# Patient Record
Sex: Female | Born: 1965 | Race: Black or African American | Hispanic: No | Smoking: Never smoker
Health system: Southern US, Community
[De-identification: ages and names within clinical notes are randomized; demographics above are authoritative.]

## PROBLEM LIST (undated history)

## (undated) DIAGNOSIS — I10 Essential (primary) hypertension: Secondary | ICD-10-CM

## (undated) DIAGNOSIS — I639 Cerebral infarction, unspecified: Secondary | ICD-10-CM

## (undated) DIAGNOSIS — N289 Disorder of kidney and ureter, unspecified: Secondary | ICD-10-CM

## (undated) DIAGNOSIS — D649 Anemia, unspecified: Secondary | ICD-10-CM

## (undated) DIAGNOSIS — E119 Type 2 diabetes mellitus without complications: Secondary | ICD-10-CM

## (undated) DIAGNOSIS — F329 Major depressive disorder, single episode, unspecified: Secondary | ICD-10-CM

## (undated) DIAGNOSIS — F32A Depression, unspecified: Secondary | ICD-10-CM

## (undated) DIAGNOSIS — I671 Cerebral aneurysm, nonruptured: Secondary | ICD-10-CM

## (undated) DIAGNOSIS — E079 Disorder of thyroid, unspecified: Secondary | ICD-10-CM

## (undated) HISTORY — PX: BRAIN SURGERY: SHX531

## (undated) HISTORY — DX: Major depressive disorder, single episode, unspecified: F32.9

## (undated) HISTORY — DX: Type 2 diabetes mellitus without complications: E11.9

## (undated) HISTORY — PX: THYROIDECTOMY: SHX17

## (undated) HISTORY — DX: Depression, unspecified: F32.A

## (undated) HISTORY — DX: Anemia, unspecified: D64.9

---

## 1999-10-09 ENCOUNTER — Other Ambulatory Visit: Admission: RE | Admit: 1999-10-09 | Discharge: 1999-10-09 | Payer: Self-pay | Admitting: Obstetrics and Gynecology

## 2001-01-05 ENCOUNTER — Other Ambulatory Visit: Admission: RE | Admit: 2001-01-05 | Discharge: 2001-01-05 | Payer: Self-pay | Admitting: Obstetrics and Gynecology

## 2001-11-30 ENCOUNTER — Inpatient Hospital Stay (HOSPITAL_COMMUNITY): Admission: AD | Admit: 2001-11-30 | Discharge: 2001-11-30 | Payer: Self-pay | Admitting: Obstetrics

## 2001-12-04 ENCOUNTER — Inpatient Hospital Stay (HOSPITAL_COMMUNITY): Admission: AD | Admit: 2001-12-04 | Discharge: 2001-12-04 | Payer: Self-pay | Admitting: Obstetrics & Gynecology

## 2001-12-04 ENCOUNTER — Encounter: Payer: Self-pay | Admitting: Obstetrics & Gynecology

## 2001-12-07 ENCOUNTER — Inpatient Hospital Stay (HOSPITAL_COMMUNITY): Admission: AD | Admit: 2001-12-07 | Discharge: 2001-12-07 | Payer: Self-pay | Admitting: Obstetrics

## 2001-12-07 ENCOUNTER — Encounter: Admission: RE | Admit: 2001-12-07 | Discharge: 2001-12-07 | Payer: Self-pay | Admitting: Obstetrics & Gynecology

## 2001-12-17 ENCOUNTER — Inpatient Hospital Stay (HOSPITAL_COMMUNITY): Admission: AD | Admit: 2001-12-17 | Discharge: 2001-12-17 | Payer: Self-pay | Admitting: Obstetrics

## 2001-12-24 ENCOUNTER — Inpatient Hospital Stay (HOSPITAL_COMMUNITY): Admission: AD | Admit: 2001-12-24 | Discharge: 2001-12-24 | Payer: Self-pay | Admitting: *Deleted

## 2002-10-16 ENCOUNTER — Other Ambulatory Visit: Admission: RE | Admit: 2002-10-16 | Discharge: 2002-10-16 | Payer: Self-pay | Admitting: Obstetrics and Gynecology

## 2003-10-29 ENCOUNTER — Other Ambulatory Visit: Admission: RE | Admit: 2003-10-29 | Discharge: 2003-10-29 | Payer: Self-pay | Admitting: Obstetrics and Gynecology

## 2003-12-13 ENCOUNTER — Emergency Department (HOSPITAL_COMMUNITY): Admission: AD | Admit: 2003-12-13 | Discharge: 2003-12-13 | Payer: Self-pay | Admitting: Family Medicine

## 2004-01-07 ENCOUNTER — Emergency Department (HOSPITAL_COMMUNITY): Admission: EM | Admit: 2004-01-07 | Discharge: 2004-01-07 | Payer: Self-pay | Admitting: Family Medicine

## 2004-09-28 ENCOUNTER — Emergency Department (HOSPITAL_COMMUNITY): Admission: EM | Admit: 2004-09-28 | Discharge: 2004-09-28 | Payer: Self-pay | Admitting: Family Medicine

## 2008-03-19 ENCOUNTER — Other Ambulatory Visit: Admission: RE | Admit: 2008-03-19 | Discharge: 2008-03-19 | Payer: Self-pay | Admitting: Gynecology

## 2009-04-29 ENCOUNTER — Emergency Department (HOSPITAL_COMMUNITY): Admission: EM | Admit: 2009-04-29 | Discharge: 2009-04-29 | Payer: Self-pay | Admitting: Emergency Medicine

## 2010-03-06 ENCOUNTER — Emergency Department (HOSPITAL_COMMUNITY): Admission: EM | Admit: 2010-03-06 | Discharge: 2010-03-06 | Payer: Self-pay | Admitting: Emergency Medicine

## 2010-10-10 ENCOUNTER — Ambulatory Visit (HOSPITAL_COMMUNITY): Payer: Self-pay | Admitting: Psychiatry

## 2010-11-04 ENCOUNTER — Ambulatory Visit (HOSPITAL_COMMUNITY): Payer: Self-pay | Admitting: Psychology

## 2010-11-18 ENCOUNTER — Ambulatory Visit (HOSPITAL_COMMUNITY): Payer: Self-pay | Admitting: Psychiatry

## 2011-02-11 LAB — PROTIME-INR: Prothrombin Time: 12.5 seconds (ref 11.6–15.2)

## 2011-02-11 LAB — BASIC METABOLIC PANEL
BUN: 14 mg/dL (ref 6–23)
CO2: 24 mEq/L (ref 19–32)
Calcium: 8.6 mg/dL (ref 8.4–10.5)
Chloride: 104 mEq/L (ref 96–112)
Creatinine, Ser: 1.18 mg/dL (ref 0.4–1.2)
GFR calc Af Amer: >60 mL/min (ref >60)
GFR calc non Af Amer: 50 mL/min — ABNORMAL LOW (ref >60)
Glucose, Bld: 168 mg/dL — ABNORMAL HIGH (ref 70–99)
Potassium: 3.6 mEq/L (ref 3.5–5.1)
Sodium: 136 mEq/L (ref 135–145)

## 2011-02-11 LAB — CBC
HCT: 40.5 % (ref 36.0–46.0)
Hemoglobin: 13.4 g/dL (ref 12.0–15.0)
MCHC: 33 g/dL (ref 30.0–36.0)
MCV: 83.9 fL (ref 78.0–100.0)
Platelets: 259 10*3/uL (ref 150–400)
RBC: 4.83 MIL/uL (ref 3.87–5.11)
RDW: 14.7 % (ref 11.5–15.5)
WBC: 6.6 10*3/uL (ref 4.0–10.5)

## 2011-02-11 LAB — DIFFERENTIAL
Basophils Absolute: 0.1 10*3/uL (ref 0.0–0.1)
Eosinophils Absolute: 0.1 10*3/uL (ref 0.0–0.7)
Eosinophils Relative: 2 % (ref 0–5)
Lymphs Abs: 2.2 10*3/uL (ref 0.7–4.0)

## 2011-02-11 LAB — POCT CARDIAC MARKERS

## 2011-04-13 ENCOUNTER — Other Ambulatory Visit (HOSPITAL_COMMUNITY): Payer: Self-pay | Admitting: Family Medicine

## 2011-04-15 ENCOUNTER — Other Ambulatory Visit (HOSPITAL_COMMUNITY): Payer: Self-pay | Admitting: Family Medicine

## 2011-04-15 DIAGNOSIS — Z1231 Encounter for screening mammogram for malignant neoplasm of breast: Secondary | ICD-10-CM

## 2011-04-24 ENCOUNTER — Ambulatory Visit (HOSPITAL_COMMUNITY)
Admission: RE | Admit: 2011-04-24 | Discharge: 2011-04-24 | Disposition: A | Discharge status: Home / Self Care | Payer: PRIVATE HEALTH INSURANCE | Source: Ambulatory Visit | Attending: Family Medicine | Admitting: Family Medicine

## 2011-04-24 DIAGNOSIS — Z1231 Encounter for screening mammogram for malignant neoplasm of breast: Secondary | ICD-10-CM | POA: Insufficient documentation

## 2011-05-19 ENCOUNTER — Other Ambulatory Visit: Payer: Self-pay | Admitting: Family Medicine

## 2011-05-19 DIAGNOSIS — R928 Other abnormal and inconclusive findings on diagnostic imaging of breast: Secondary | ICD-10-CM

## 2011-05-21 ENCOUNTER — Ambulatory Visit
Admission: RE | Admit: 2011-05-21 | Discharge: 2011-05-21 | Disposition: A | Discharge status: Home / Self Care | Payer: PRIVATE HEALTH INSURANCE | Source: Ambulatory Visit | Attending: Family Medicine | Admitting: Family Medicine

## 2011-05-21 DIAGNOSIS — R928 Other abnormal and inconclusive findings on diagnostic imaging of breast: Secondary | ICD-10-CM

## 2011-06-09 ENCOUNTER — Emergency Department (HOSPITAL_COMMUNITY): Payer: PRIVATE HEALTH INSURANCE

## 2011-06-09 ENCOUNTER — Emergency Department (HOSPITAL_COMMUNITY)
Admission: EM | Admit: 2011-06-09 | Discharge: 2011-06-09 | Disposition: A | Discharge status: Home / Self Care | Payer: PRIVATE HEALTH INSURANCE | Attending: Emergency Medicine | Admitting: Emergency Medicine

## 2011-06-09 DIAGNOSIS — R51 Headache: Secondary | ICD-10-CM | POA: Insufficient documentation

## 2011-06-09 DIAGNOSIS — Z9889 Other specified postprocedural states: Secondary | ICD-10-CM | POA: Insufficient documentation

## 2011-06-09 DIAGNOSIS — G9389 Other specified disorders of brain: Secondary | ICD-10-CM | POA: Insufficient documentation

## 2011-06-09 DIAGNOSIS — R42 Dizziness and giddiness: Secondary | ICD-10-CM | POA: Insufficient documentation

## 2011-06-09 DIAGNOSIS — N289 Disorder of kidney and ureter, unspecified: Secondary | ICD-10-CM | POA: Insufficient documentation

## 2011-06-09 DIAGNOSIS — E039 Hypothyroidism, unspecified: Secondary | ICD-10-CM | POA: Insufficient documentation

## 2011-06-09 LAB — POCT I-STAT, CHEM 8
Creatinine, Ser: 1.7 mg/dL — ABNORMAL HIGH (ref 0.50–1.10)
Hemoglobin: 12.9 g/dL (ref 12.0–15.0)
Potassium: 4 mEq/L (ref 3.5–5.1)
Sodium: 139 mEq/L (ref 135–145)

## 2011-06-19 ENCOUNTER — Inpatient Hospital Stay (INDEPENDENT_AMBULATORY_CARE_PROVIDER_SITE_OTHER)
Admission: RE | Admit: 2011-06-19 | Discharge: 2011-06-19 | Disposition: A | Discharge status: Home / Self Care | Payer: PRIVATE HEALTH INSURANCE | Source: Ambulatory Visit | Attending: Family Medicine | Admitting: Family Medicine

## 2011-06-19 DIAGNOSIS — M25469 Effusion, unspecified knee: Secondary | ICD-10-CM

## 2011-12-26 ENCOUNTER — Encounter (HOSPITAL_COMMUNITY): Payer: Self-pay | Admitting: *Deleted

## 2011-12-26 ENCOUNTER — Emergency Department (HOSPITAL_COMMUNITY)
Admission: EM | Admit: 2011-12-26 | Discharge: 2011-12-26 | Disposition: A | Discharge status: Home / Self Care | Payer: Self-pay | Attending: Emergency Medicine | Admitting: Emergency Medicine

## 2011-12-26 DIAGNOSIS — Z79899 Other long term (current) drug therapy: Secondary | ICD-10-CM | POA: Insufficient documentation

## 2011-12-26 DIAGNOSIS — I10 Essential (primary) hypertension: Secondary | ICD-10-CM | POA: Insufficient documentation

## 2011-12-26 HISTORY — DX: Cerebral aneurysm, nonruptured: I67.1

## 2011-12-26 HISTORY — DX: Essential (primary) hypertension: I10

## 2011-12-26 HISTORY — DX: Disorder of kidney and ureter, unspecified: N28.9

## 2011-12-26 NOTE — ED Provider Notes (Signed)
History     CSN: [id number]  Arrival date & time 12/26/11  1208   First MD Initiated Contact with Patient 12/26/11 1242      Chief Complaint  Patient presents with  . Hypertension    (Consider location/radiation/quality/duration/timing/severity/associated sxs/prior treatment) Patient is a 46 y.o. female presenting with hypertension. The history is provided by the patient.  Hypertension This is a recurrent problem. The current episode started today. The problem occurs constantly. The problem has been resolved. Pertinent negatives include no abdominal pain, chest pain, diaphoresis, fatigue, fever, headaches, nausea, neck pain, numbness, rash, vertigo, visual change, vomiting or weakness. The symptoms are aggravated by nothing. She has tried nothing for the symptoms.  Pt saw nephrologist yesterday and was told to check her blood pressure daily. Reports to me she was visiting the nephrologist to discuss labs and her kidney function "is at 45". Check BP today multiple times with a home wrist cuff and had values 160-180 systolic and diasolic values in the 110-120 range. There were no other symptoms associated with the elevated blood pressure readings.  Past Medical History  Diagnosis Date  . Hypertension   . Renal disorder   . Brain aneurysm     Past Surgical History  Procedure Date  . Thyroidectomy     No family history on file.  History  Substance Use Topics  . Smoking status: Never Smoker   . Smokeless tobacco: Not on file  . Alcohol Use: Yes     social     Review of Systems  Constitutional: Negative for fever, diaphoresis and fatigue.  HENT: Negative for nosebleeds, neck pain and neck stiffness.   Eyes: Negative for pain and visual disturbance.  Respiratory: Negative for chest tightness and shortness of breath.   Cardiovascular: Negative for chest pain.  Gastrointestinal: Negative for nausea, vomiting and abdominal pain.  Musculoskeletal: Negative for back pain and gait  problem.  Skin: Negative for rash.  Neurological: Negative for dizziness, vertigo, speech difficulty, weakness, numbness and headaches.  Psychiatric/Behavioral: Negative for confusion.    Allergies  Hydrocodone  Home Medications   Current Outpatient Rx  Name Route Sig Dispense Refill  . LABETALOL HCL 200 MG PO TABS Oral Take 200 mg by mouth 2 (two) times daily.    Marland Kitchen LEVOTHYROXINE SODIUM 175 MCG PO TABS Oral Take 175 mcg by mouth daily.    . MULTI-VITAMIN/MINERALS PO TABS Oral Take 1 tablet by mouth daily.    Marland Kitchen OLMESARTAN MEDOXOMIL-HCTZ 20-12.5 MG PO TABS Oral Take 1 tablet by mouth daily.      BP 121/72  Pulse 66  Temp(Src) 98.8 F (37.1 C) (Oral)  Resp 16  SpO2 98%  Physical Exam  Nursing note and vitals reviewed. Constitutional: She is oriented to person, place, and time. She appears well-developed and well-nourished. No distress.  HENT:  Head: Normocephalic and atraumatic.  Right Ear: External ear normal.  Left Ear: External ear normal.  Mouth/Throat: Oropharynx is clear and moist.  Eyes: Pupils are equal, round, and reactive to light.  Neck: Normal range of motion.  Cardiovascular: Normal rate, regular rhythm, normal heart sounds and intact distal pulses.   Pulmonary/Chest: Effort normal. No respiratory distress.  Abdominal: Soft. She exhibits no distension. There is no tenderness.  Musculoskeletal: She exhibits no edema and no tenderness.  Neurological: She is alert and oriented to person, place, and time. No cranial nerve deficit. She exhibits normal muscle tone. Coordination normal.  Skin: Skin is warm and dry. No rash noted.  Psychiatric: She has a normal mood and affect. Her behavior is normal.    ED Course  Procedures (including critical care time)  Labs Reviewed - No data to display No results found.   1. HTN (hypertension)       MDM  Reported HTN at home. No HTN in ED on 3 separate checks- checked by myself on exam with result of 139/76. Pt in no  distress and with no physical complaints or abnormal PE findings. Reports to me recent lab studies that have been reviewed by nephrologist. Will d/c home.Encouraged continued BP monitoring.        Shaaron Adler, [place] 12/26/11 1319

## 2011-12-26 NOTE — ED Provider Notes (Signed)
Medical screening examination/treatment/procedure(s) were performed by non-physician practitioner and as supervising physician I was immediately available for consultation/collaboration.   Keisha Amer, MD 12/26/11 1604 

## 2011-12-26 NOTE — ED Notes (Signed)
Pt states she was advised by her nephrologist yesterday to take her BP on a daily basis.  Pt took her BP at home today using an automated wrist cuff and her systolic BP was from 160 - 180 over 3 hours.  Pt denies N/V, SOB, chest pain, lethargy and dizziness.

## 2011-12-29 ENCOUNTER — Emergency Department (INDEPENDENT_AMBULATORY_CARE_PROVIDER_SITE_OTHER)
Admission: EM | Admit: 2011-12-29 | Discharge: 2011-12-29 | Disposition: A | Discharge status: Home / Self Care | Payer: Self-pay | Source: Home / Self Care | Attending: Family Medicine | Admitting: Family Medicine

## 2011-12-29 ENCOUNTER — Encounter (HOSPITAL_COMMUNITY): Payer: Self-pay

## 2011-12-29 DIAGNOSIS — J069 Acute upper respiratory infection, unspecified: Secondary | ICD-10-CM

## 2011-12-29 HISTORY — DX: Cerebral infarction, unspecified: I63.9

## 2011-12-29 MED ORDER — IPRATROPIUM BROMIDE 0.06 % NA SOLN: 2.0000 | Freq: Four times a day (QID) | NASAL | Status: DC

## 2011-12-29 NOTE — ED Provider Notes (Signed)
History     CSN: [id number]  Arrival date & time 12/29/11  0803   First MD Initiated Contact with Patient 12/29/11 [phone]      Chief Complaint  Patient presents with  . Sinusitis    (Consider location/radiation/quality/duration/timing/severity/associated sxs/prior treatment) Patient is a 46 y.o. female presenting with sinusitis. The history is provided by the patient.  Sinusitis  This is a new problem. The current episode started more than 2 days ago. The problem has not changed since onset.There has been no fever. The patient is experiencing no pain. Associated symptoms include congestion and sore throat. The treatment provided no relief.    Past Medical History  Diagnosis Date  . Hypertension   . Renal disorder   . Brain aneurysm   . Stroke     Past Surgical History  Procedure Date  . Thyroidectomy   . Brain surgery     No family history on file.  History  Substance Use Topics  . Smoking status: Never Smoker   . Smokeless tobacco: Not on file  . Alcohol Use: Yes     social    OB History    Grav Para Term Preterm Abortions TAB SAB Ect Mult Living                  Review of Systems  Constitutional: Negative.   HENT: Positive for congestion, sore throat, rhinorrhea and postnasal drip. Negative for nosebleeds.   Respiratory: Negative.   Skin: Negative.     Allergies  Hydrocodone  Home Medications   Current Outpatient Rx  Name Route Sig Dispense Refill  . IRON 325 (65 FE) MG PO TABS Oral Take by mouth daily.    . METHYLDOPA 250 MG PO TABS Oral Take 250 mg by mouth 3 (three) times daily.    . IPRATROPIUM BROMIDE 0.06 % NA SOLN Nasal Place 2 sprays into the nose 4 (four) times daily. 15 mL 12  . LABETALOL HCL 200 MG PO TABS Oral Take 200 mg by mouth 2 (two) times daily.    Marland Kitchen LEVOTHYROXINE SODIUM 175 MCG PO TABS Oral Take 175 mcg by mouth daily.    . MULTI-VITAMIN/MINERALS PO TABS Oral Take 1 tablet by mouth daily.    Marland Kitchen OLMESARTAN MEDOXOMIL-HCTZ 20-12.5 MG  PO TABS Oral Take 1 tablet by mouth daily.      BP 144/87  Pulse 89  Temp(Src) 98.5 F (36.9 C) (Oral)  Resp 17  SpO2 95%  LMP 12/27/2011  Physical Exam  Nursing note and vitals reviewed. Constitutional: She is oriented to person, place, and time. She appears well-developed and well-nourished.  HENT:  Head: Normocephalic.  Right Ear: External ear normal.  Left Ear: External ear normal.  Nose: Mucosal edema and rhinorrhea present.  Mouth/Throat: Oropharynx is clear and moist.  Eyes: Pupils are equal, round, and reactive to light.  Neck: Normal range of motion. Neck supple.  Cardiovascular: Normal rate, regular rhythm, normal heart sounds and intact distal pulses.   Pulmonary/Chest: Effort normal and breath sounds normal.  Lymphadenopathy:    She has no cervical adenopathy.  Neurological: She is alert and oriented to person, place, and time.  Skin: Skin is warm and dry.    ED Course  Procedures (including critical care time)  Labs Reviewed - No data to display No results found.   1. URI (upper respiratory infection)       MDM         Barkley Bruns, MD 12/29/11 [phone]

## 2011-12-29 NOTE — ED Notes (Signed)
C/o nasal and sinus congestion, runny nose and nausea for 3 days.  Denies fever.  States taking mucinex without improvement.

## 2012-02-03 ENCOUNTER — Other Ambulatory Visit: Payer: Self-pay | Admitting: Family Medicine

## 2012-02-12 ENCOUNTER — Other Ambulatory Visit (HOSPITAL_COMMUNITY): Payer: Self-pay | Admitting: Family Medicine

## 2012-02-12 DIAGNOSIS — Z1231 Encounter for screening mammogram for malignant neoplasm of breast: Secondary | ICD-10-CM

## 2012-04-27 ENCOUNTER — Ambulatory Visit (HOSPITAL_COMMUNITY)
Admission: RE | Admit: 2012-04-27 | Discharge: 2012-04-27 | Disposition: A | Discharge status: Home / Self Care | Payer: Self-pay | Source: Ambulatory Visit | Attending: Family Medicine | Admitting: Family Medicine

## 2012-04-27 DIAGNOSIS — Z1231 Encounter for screening mammogram for malignant neoplasm of breast: Secondary | ICD-10-CM | POA: Insufficient documentation

## 2012-08-04 ENCOUNTER — Encounter (HOSPITAL_COMMUNITY): Payer: Self-pay | Admitting: *Deleted

## 2012-08-04 ENCOUNTER — Emergency Department (INDEPENDENT_AMBULATORY_CARE_PROVIDER_SITE_OTHER)
Admission: EM | Admit: 2012-08-04 | Discharge: 2012-08-04 | Disposition: A | Discharge status: Home / Self Care | Payer: Self-pay | Source: Home / Self Care | Attending: Emergency Medicine | Admitting: Emergency Medicine

## 2012-08-04 DIAGNOSIS — Z659 Problem related to unspecified psychosocial circumstances: Secondary | ICD-10-CM

## 2012-08-04 DIAGNOSIS — Z658 Other specified problems related to psychosocial circumstances: Secondary | ICD-10-CM

## 2012-08-04 HISTORY — DX: Disorder of thyroid, unspecified: E07.9

## 2012-08-04 NOTE — ED Notes (Signed)
Pt is here with complaints of runny nose with onset 08/02/12.  Has been using OTC mucinex.  Denies cough, fever, sore throat or any additional symptoms.

## 2012-08-04 NOTE — ED Provider Notes (Signed)
History     CSN: [id number]  Arrival date & time 08/04/12  6962   First MD Initiated Contact with Patient 08/04/12 [phone]      Chief Complaint  Patient presents with  . Nasal Congestion    (Consider location/radiation/quality/duration/timing/severity/associated sxs/prior treatment) HPI Comments: Patient presented to urgent care after having been signed in an interview by intake nurse, she reveals to me during interview but she's not here for any symptoms. Yes she needs a physician's signature for certain forms she has to obtain section 8 and disability papers. Patient was a [facility] patient, and was unaware that the clinic as close when she tried to call and we checked for her primary care doctor.  The history is provided by the patient.    Past Medical History  Diagnosis Date  . Hypertension   . Renal disorder   . Brain aneurysm   . Stroke   . Thyroid disease     Past Surgical History  Procedure Date  . Thyroidectomy   . Brain surgery     History reviewed. No pertinent family history.  History  Substance Use Topics  . Smoking status: Never Smoker   . Smokeless tobacco: Not on file  . Alcohol Use: Yes     social    OB History    Grav Para Term Preterm Abortions TAB SAB Ect Mult Living                  Review of Systems  Constitutional: Negative for activity change.  HENT: Negative for congestion and rhinorrhea.   Respiratory: Negative for cough.   Cardiovascular: Negative for chest pain.    Allergies  Hydrocodone  Home Medications   Current Outpatient Rx  Name Route Sig Dispense Refill  . METFORMIN HCL 1000 MG PO TABS Oral Take 1,000 mg by mouth 2 (two) times daily with a meal.    . IRON 325 (65 FE) MG PO TABS Oral Take by mouth daily.    . IPRATROPIUM BROMIDE 0.06 % NA SOLN Nasal Place 2 sprays into the nose 4 (four) times daily. 15 mL 12  . LABETALOL HCL 200 MG PO TABS Oral Take 200 mg by mouth 2 (two) times daily.    Marland Kitchen LEVOTHYROXINE SODIUM 175  MCG PO TABS Oral Take 175 mcg by mouth daily.    . METHYLDOPA 250 MG PO TABS Oral Take 250 mg by mouth 3 (three) times daily.    . MULTI-VITAMIN/MINERALS PO TABS Oral Take 1 tablet by mouth daily.    Marland Kitchen OLMESARTAN MEDOXOMIL-HCTZ 20-12.5 MG PO TABS Oral Take 1 tablet by mouth daily.      BP 111/75  Pulse 73  Temp 98.3 F (36.8 C) (Oral)  Resp 12  SpO2 97%  LMP 07/18/2012  Physical Exam  Nursing note and vitals reviewed. Constitutional: She appears well-developed and well-nourished.  Neurological: She is alert.  Psychiatric: She has a normal mood and affect. Her behavior is normal.    ED Course  Procedures (including critical care time)  Labs Reviewed - No data to display No results found.   1. Social problem       MDM   Patient presented this morning with a false symptomatology of nasal congestion. Upon be meeting the patient she admitted " I'm not here for any nasal congestion, I'm here because I need a physician to sign a disability form and 8 document for me to obtain a section 8 housing". Patient was given several local resources to  establish with a primary care Dr. that could help her with documentation purposes, healthcare maintenance, and a sister with her chronic medical conditions that would be a better position to evaluate her disability eligibility. Patient understands and agrees to followup with her primary care Dr.      Jimmie Molly, MD 08/04/12 1052

## 2012-08-10 ENCOUNTER — Emergency Department (HOSPITAL_COMMUNITY): Payer: Self-pay

## 2012-08-10 ENCOUNTER — Encounter (HOSPITAL_COMMUNITY): Payer: Self-pay | Admitting: Family Medicine

## 2012-08-10 ENCOUNTER — Emergency Department (HOSPITAL_COMMUNITY)
Admission: EM | Admit: 2012-08-10 | Discharge: 2012-08-10 | Disposition: A | Discharge status: Home / Self Care | Payer: Self-pay | Attending: Emergency Medicine | Admitting: Emergency Medicine

## 2012-08-10 DIAGNOSIS — Z885 Allergy status to narcotic agent status: Secondary | ICD-10-CM | POA: Insufficient documentation

## 2012-08-10 DIAGNOSIS — R51 Headache: Secondary | ICD-10-CM | POA: Insufficient documentation

## 2012-08-10 DIAGNOSIS — I10 Essential (primary) hypertension: Secondary | ICD-10-CM | POA: Insufficient documentation

## 2012-08-10 MED ORDER — ACETAMINOPHEN 325 MG PO TABS
975.0000 mg | ORAL_TABLET | Freq: Once | ORAL | Status: AC
  Administered 2012-08-10: 975 mg via ORAL
  Filled 2012-08-10: qty 3
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
## 2012-08-10 NOTE — ED Provider Notes (Signed)
Medical screening examination/treatment/procedure(s) were performed by non-physician practitioner and as supervising physician I was immediately available for consultation/collaboration.   Carlissa Pesola, MD 08/10/12 1625 
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
## 2012-08-10 NOTE — ED Notes (Signed)
Jody, social worker at bedside. 
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
## 2012-08-10 NOTE — ED Notes (Signed)
Patient transported to CT 
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
## 2012-09-06 ENCOUNTER — Ambulatory Visit (INDEPENDENT_AMBULATORY_CARE_PROVIDER_SITE_OTHER): Payer: Self-pay | Admitting: Family Medicine

## 2012-09-06 ENCOUNTER — Encounter: Payer: Self-pay | Admitting: Family Medicine

## 2012-09-06 VITALS — BP 123/75 | HR 70 | Temp 98.9°F | Ht 62.0 in | Wt 195.0 lb

## 2012-09-06 DIAGNOSIS — Z8679 Personal history of other diseases of the circulatory system: Secondary | ICD-10-CM

## 2012-09-06 DIAGNOSIS — Z9889 Other specified postprocedural states: Secondary | ICD-10-CM

## 2012-09-06 DIAGNOSIS — E119 Type 2 diabetes mellitus without complications: Secondary | ICD-10-CM

## 2012-09-06 DIAGNOSIS — I10 Essential (primary) hypertension: Secondary | ICD-10-CM

## 2012-09-06 DIAGNOSIS — R51 Headache: Secondary | ICD-10-CM

## 2012-09-06 MED ORDER — LABETALOL HCL 200 MG PO TABS: 400.0000 mg | ORAL_TABLET | Freq: Two times a day (BID) | ORAL | Status: AC

## 2012-09-06 MED ORDER — LEVOTHYROXINE SODIUM 150 MCG PO TABS: 150.0000 ug | ORAL_TABLET | Freq: Every day | ORAL | Status: DC

## 2012-09-06 MED ORDER — METFORMIN HCL 500 MG PO TABS: 500.0000 mg | ORAL_TABLET | Freq: Two times a day (BID) | ORAL | Status: DC

## 2012-09-06 MED ORDER — OLMESARTAN MEDOXOMIL-HCTZ 20-12.5 MG PO TABS: 1.0000 | ORAL_TABLET | Freq: Every day | ORAL | Status: DC

## 2012-09-06 NOTE — Assessment & Plan Note (Signed)
Refill on metformin- history indicates she has been relatively well controlled.

## 2012-09-06 NOTE — Patient Instructions (Signed)
It was nice to meet you.  Your blood pressure today was BP: 123/75 mmHg.  Remember your goal blood pressure is about 130/80.  Please be sure to take your medication every day.  Great Job!  Please start taking the metformin again.  Please let me know if you have any problems with the cost of your medications.   I will look through your paperwork to see about filling out the disability form.   Please come back and see me in a few months when you have to orange card so we can check your cholesterol and other blood work.

## 2012-09-06 NOTE — Progress Notes (Signed)
  Subjective:    Patient ID: Crystal Weiss, female    DOB: 1966-03-18, 46 y.o.   MRN: [id number]  HPI  Crystal Weiss presents to establish care.  She complains today that she had a brain aneurysm in 2011 that she had to have surgery for, and since then she has had chronic headaches with exertion.  She used to work at [employer] and has not been able to do that job since then.  She has applied for other jobs that are less active, but has not been able to get one.  She says that due to all that has happened and not being able to work much she has not been able to make enough money to keep an apartment, and so she is having to stay with her ex-husband.  She would like me to fill out paperwork to very she has a disability.    Otherwise, she says she is doing fairly well.  She takes brand name synthroid for her thyroid (had  removed in 2008 due to nodule).  She takes metformin for her DM (last A1c 6.1).  Takes Labetalol and Benicar for HTN.    Past Medical History  Diagnosis Date  . Hypertension   . Renal disorder   . Brain aneurysm   . Stroke   . Thyroid disease   . Anemia   . Depression   . Diabetes mellitus without complication    Family History  Problem Relation Age of Onset  . Diabetes Mother   . Cancer Mother 23    Breast  . Cancer Father     Lung cancer  . Diabetes Sister   . Hypertension Sister   . Diabetes Brother   . Cancer Maternal Aunt     Ovarian  . Diabetes Maternal Aunt   . Kidney disease Maternal Aunt    History  Substance Use Topics  . Smoking status: Never Smoker   . Smokeless tobacco: Never Used  . Alcohol Use: 0.6 oz/week    1 Glasses of wine per week     social   Review of Systems See HPI    Objective:   Physical Exam BP 123/75  Pulse 70  Temp 98.9 F (37.2 C) (Oral)  Ht 5\' 2"  (1.575 m)  Wt 195 lb (88.451 kg)  BMI 35.67 kg/m2  LMP 08/09/2012 General appearance: alert, cooperative and no distress Lungs: clear to auscultation bilaterally Heart:  regular rate and rhythm, S1, S2 normal, no murmur, click, rub or gallop Extremities: extremities normal, atraumatic, no cyanosis or edema Pulses: 2+ and symmetric Neurologic: Alert and oriented X 3, normal strength and tone. Normal symmetric reflexes. Normal coordination and gait Cranial nerves: normal       Assessment & Plan:

## 2012-09-06 NOTE — Assessment & Plan Note (Signed)
Well controlled- will refill all medications, told her if cost becomes an issue we can consider changing to $4 list.

## 2012-09-06 NOTE — Assessment & Plan Note (Signed)
Exam is completely normal today- unfortunately I do not have much in the way of records about this.  I do not think I have sufficient evidence to fill out any disability paperwork.  Will notify patient and try to get records from her neurologist.

## 2012-09-06 NOTE — Assessment & Plan Note (Signed)
Clinically stable- will try to obtain records.

## 2012-09-07 ENCOUNTER — Telehealth: Payer: Self-pay | Admitting: Family Medicine

## 2012-09-07 NOTE — Telephone Encounter (Signed)
Called patient, left message.  She established care in our clinic yesterday and asked me to fill out disability paperwork for housing.  I have reviewed her records from [facility] and visits to ER/Urgent care in [place].  There is no indication of a disability in any of her records, so I will not be able to fill out the disability paperwork.  I told her to call the office if she has questions.

## 2012-09-22 ENCOUNTER — Telehealth: Payer: Self-pay | Admitting: Family Medicine

## 2012-09-22 MED ORDER — LISINOPRIL-HYDROCHLOROTHIAZIDE 20-12.5 MG PO TABS: 1.0000 | ORAL_TABLET | Freq: Every day | ORAL | Status: AC

## 2012-09-22 NOTE — Telephone Encounter (Signed)
Patient is calling about her BP medication.She needs a medication that will be less expensive to replace the Benacar.  She uses [facility] on [address].

## 2012-09-22 NOTE — Telephone Encounter (Signed)
Called patient to verify- Has taken Lisinopril in the past, but never had an allergy to it.  Rx for Lisinopril/HCTZ sent to [facility] to replace Olmesartan/HCTZ (Benicar).

## 2012-10-06 ENCOUNTER — Encounter (HOSPITAL_COMMUNITY): Payer: Self-pay

## 2012-10-06 ENCOUNTER — Emergency Department (HOSPITAL_COMMUNITY): Payer: Self-pay

## 2012-10-06 ENCOUNTER — Emergency Department (HOSPITAL_COMMUNITY)
Admission: EM | Admit: 2012-10-06 | Discharge: 2012-10-06 | Disposition: A | Discharge status: Home / Self Care | Payer: Self-pay | Attending: Emergency Medicine | Admitting: Emergency Medicine

## 2012-10-06 DIAGNOSIS — Z8679 Personal history of other diseases of the circulatory system: Secondary | ICD-10-CM | POA: Insufficient documentation

## 2012-10-06 DIAGNOSIS — Z8673 Personal history of transient ischemic attack (TIA), and cerebral infarction without residual deficits: Secondary | ICD-10-CM | POA: Insufficient documentation

## 2012-10-06 DIAGNOSIS — E119 Type 2 diabetes mellitus without complications: Secondary | ICD-10-CM | POA: Insufficient documentation

## 2012-10-06 DIAGNOSIS — G8929 Other chronic pain: Secondary | ICD-10-CM | POA: Insufficient documentation

## 2012-10-06 DIAGNOSIS — I10 Essential (primary) hypertension: Secondary | ICD-10-CM | POA: Insufficient documentation

## 2012-10-06 DIAGNOSIS — M545 Low back pain, unspecified: Secondary | ICD-10-CM | POA: Insufficient documentation

## 2012-10-06 DIAGNOSIS — Z8659 Personal history of other mental and behavioral disorders: Secondary | ICD-10-CM | POA: Insufficient documentation

## 2012-10-06 DIAGNOSIS — Z8742 Personal history of other diseases of the female genital tract: Secondary | ICD-10-CM | POA: Insufficient documentation

## 2012-10-06 DIAGNOSIS — D649 Anemia, unspecified: Secondary | ICD-10-CM | POA: Insufficient documentation

## 2012-10-06 DIAGNOSIS — M549 Dorsalgia, unspecified: Secondary | ICD-10-CM

## 2012-10-06 DIAGNOSIS — Z79899 Other long term (current) drug therapy: Secondary | ICD-10-CM | POA: Insufficient documentation

## 2012-10-06 DIAGNOSIS — E079 Disorder of thyroid, unspecified: Secondary | ICD-10-CM | POA: Insufficient documentation

## 2012-10-06 MED ORDER — TRAMADOL HCL 50 MG PO TABS: 50.0000 mg | ORAL_TABLET | Freq: Four times a day (QID) | ORAL | Status: AC | PRN

## 2012-10-06 MED ORDER — TRAMADOL HCL 50 MG PO TABS
50.0000 mg | ORAL_TABLET | Freq: Once | ORAL | Status: AC
  Administered 2012-10-06: 50 mg via ORAL
  Filled 2012-10-06: qty 1

## 2012-10-06 MED ORDER — DIAZEPAM 5 MG PO TABS: 5.0000 mg | ORAL_TABLET | Freq: Two times a day (BID) | ORAL | Status: AC

## 2012-10-06 MED ORDER — DIAZEPAM 5 MG PO TABS
5.0000 mg | ORAL_TABLET | Freq: Once | ORAL | Status: AC
  Administered 2012-10-06: 5 mg via ORAL
  Filled 2012-10-06: qty 1

## 2012-10-06 MED ORDER — HYDROCODONE-ACETAMINOPHEN 5-325 MG PO TABS
1.0000 | ORAL_TABLET | Freq: Once | ORAL | Status: AC
  Administered 2012-10-06: 1 via ORAL
  Filled 2012-10-06: qty 1

## 2012-10-06 NOTE — ED Notes (Signed)
Pt presents with year-long h/o low back pain.  Pt denies any injury, reports pain began again x 2 weeks ago with pain worse last night.  Pt reports pain radiates down L leg, pt denies any urinary or fecal incontinence.

## 2012-10-06 NOTE — ED Notes (Signed)
Pt states she is unable to take Hydrocodone due to itching. NOT GIVEN.

## 2012-10-06 NOTE — ED Notes (Signed)
States has had CT scan in the past that was "normal".

## 2012-10-06 NOTE — ED Notes (Signed)
Patient transported to X-ray 

## 2012-10-06 NOTE — ED Provider Notes (Signed)
History  Scribed for Gerhard Munch, MD, the patient was seen in room TR11C/TR11C. This chart was scribed by Candelaria Stagers. The patient's care started at 12:41 PM   CSN: [id number]  Arrival date & time 10/06/12  1208   First MD Initiated Contact with Patient 10/06/12 1235      Chief Complaint  Patient presents with  . Back Pain     The history is provided by the patient. No language interpreter was used.   Crystal Weiss is a 46 y.o. female who presents to the Emergency Department complaining of chronic lower back pain that became worse over the last two weeks.  Pt reports that walking is becoming difficult and also reports that last week her entire left leg became numb.  She denies any falls or trauma.  She has taken ibuprofen with no relief.  She has not seen a specialist for the back pain.  Pt has h/o HTN and brain aneurysm with a shunt in place.  She reports that she occasionally gets headache.    Past Medical History  Diagnosis Date  . Hypertension   . Renal disorder   . Brain aneurysm   . Stroke   . Thyroid disease   . Anemia   . Depression   . Diabetes mellitus without complication     Past Surgical History  Procedure Date  . Thyroidectomy   . Brain surgery     Family History  Problem Relation Age of Onset  . Diabetes Mother   . Cancer Mother 73    Breast  . Cancer Father     Lung cancer  . Diabetes Sister   . Hypertension Sister   . Diabetes Brother   . Cancer Maternal Aunt     Ovarian  . Diabetes Maternal Aunt   . Kidney disease Maternal Aunt     History  Substance Use Topics  . Smoking status: Never Smoker   . Smokeless tobacco: Never Used  . Alcohol Use: 0.6 oz/week    1 Glasses of wine per week     Comment: social    OB History    Grav Para Term Preterm Abortions TAB SAB Ect Mult Living                  Review of Systems  Constitutional: Negative for fever and chills.       Per HPI, otherwise negative  HENT:       Per HPI,  otherwise negative  Eyes: Negative.   Respiratory:       Per HPI, otherwise negative  Cardiovascular:       Per HPI, otherwise negative  Gastrointestinal: Negative for vomiting and abdominal pain.  Genitourinary: Negative for difficulty urinating.  Musculoskeletal: Positive for back pain (lower back pain).       Per HPI, otherwise negative  Skin: Negative.   Neurological: Negative for syncope and weakness.  All other systems reviewed and are negative.    Allergies  Hydrocodone  Home Medications   Current Outpatient Rx  Name  Route  Sig  Dispense  Refill  . CALCIUM CARBONATE-VITAMIN D 500-200 MG-UNIT PO TABS   Oral   Take 1 tablet by mouth daily.         . IRON 325 (65 FE) MG PO TABS   Oral   Take by mouth daily.         Marland Kitchen LABETALOL HCL 200 MG PO TABS   Oral   Take 2 tablets (400 mg total)  by mouth 2 (two) times daily.   120 tablet   5   . LEVOTHYROXINE SODIUM 150 MCG PO TABS   Oral   Take 1 tablet (150 mcg total) by mouth daily. Takes brand name   30 tablet   5   . LISINOPRIL-HYDROCHLOROTHIAZIDE 20-12.5 MG PO TABS   Oral   Take 1 tablet by mouth daily.   90 tablet   3   . METFORMIN HCL 500 MG PO TABS   Oral   Take 1 tablet (500 mg total) by mouth 2 (two) times daily with a meal.   60 tablet   5   . MULTI-VITAMIN/MINERALS PO TABS   Oral   Take 1 tablet by mouth daily.           BP 115/56  Pulse 74  Temp 98.6 F (37 C) (Oral)  Resp 16  SpO2 99%  LMP 09/11/2012  Physical Exam  Nursing note and vitals reviewed. Constitutional: She is oriented to person, place, and time. She appears well-developed and well-nourished. No distress.  HENT:  Head: Normocephalic and atraumatic.  Eyes: EOM are normal.  Neck: Neck supple. No tracheal deviation present.  Cardiovascular: Normal rate and regular rhythm.   Pulmonary/Chest: Effort normal. No respiratory distress. She has no wheezes. She has no rales.  Musculoskeletal: Normal range of motion.        Lower lumbar tenderness bilaterally.  Superior SI joint tenderness on the left.  Normal pulses distally.  Distally normal plantar and dorsiflexion left ankle.  Normal flexion and extension of left knee.    Neurological: She is alert and oriented to person, place, and time.  Skin: Skin is warm and dry.  Psychiatric: She has a normal mood and affect. Her behavior is normal.    ED Course  Procedures  DIAGNOSTIC STUDIES: Oxygen Saturation is 99% on room air, normal by my interpretation.    COORDINATION OF CARE:  12:50 Ordered: DG Lumbar Spine 2-3 Views   Labs Reviewed - No data to display Dg Lumbar Spine 2-3 Views  10/06/2012  *RADIOLOGY REPORT*  Clinical Data: Pain at left superior SI joint, left lower back pain  LUMBAR SPINE - 2-3 VIEW  Comparison: None  Findings: Five non-rib bearing lumbar vertebrae. Osseous mineralization normal. Minimal disc space narrowing L4-L5 with minimal anterolisthesis. No acute fracture, additional subluxation or bone destruction. No gross evidence of spondylolysis. SI joints symmetric. Mild facet degenerative changes lower lumbar spine. VP shunt tubing in abdomen. Numerous pelvic phleboliths.  IMPRESSION: Minimal degenerative disc and facet disease changes L4-L5.   Original Report Authenticated By: Ulyses Southward, M.D.      No diagnosis found.    MDM  I personally performed the services described in this documentation, which was scribed in my presence. The recorded information has been reviewed and is accurate.  This generally well female presents with concerns of ongoing back pain.  On exam she is uncomfortable appearing, with exquisite tenderness about the left superior sacro-iliac junction.  The absence of neurologic complaints, findings, fever, chills, superficial changes his reassuring for the low suspicion of acute cauda equina, septic arthropathy, or other acute process.  The patient was discharged in stable condition with analgesics, PMD  followup.  Gerhard Munch, MD 10/06/12 1350

## 2012-10-24 ENCOUNTER — Ambulatory Visit: Payer: Self-pay

## 2012-10-24 ENCOUNTER — Telehealth: Payer: Self-pay | Admitting: *Deleted

## 2012-10-24 NOTE — Telephone Encounter (Signed)
Patient did not keep work-in appt today.  Called patient and she c/o not feeling well due to possible sinus infection.  Patient wants to reschedule appt until tomorrow.  States she has no thoughts or plans to harm herself.  Patient will go to ED or urgent care if unable to wait to be evaluated tomorrow.  No appts available tomorrow and scheduled on overflow clinic for 2:00 pm.  Gaylene Brooks, RN

## 2012-10-24 NOTE — Telephone Encounter (Signed)
Patient calling to request medication for suicidal ideations, depression, and anxiety.  "Has been feeling this way for 3 or 4 years since my aneurysm."  Patient does not have a plan to harm herself.  Has a friend Saunders Glance) there with her now.  Discussed with Dr. Lula Olszewski.  Patient will need an office visit.  Appt scheduled on overflow clinic for today at 1:45 pm.  Gaylene Brooks, RN

## 2012-10-25 ENCOUNTER — Ambulatory Visit: Payer: Self-pay

## 2012-11-11 IMAGING — CT CT HEAD W/O CM
1 of 2 series · 13 of 30 positions shown, 17 images · non-contrast
Comparison: 06/09/2011.

CLINICAL DATA: Head trauma.

CT HEAD WITHOUT CONTRAST
TECHNIQUE: Contiguous axial images were obtained from the base of
the skull through the vertex without contrast.

[Series 2: brain · axial · 0.47mm/px · z∈[+15,+145]mm · 13 of 32 slices shown, 17 images]
[im 3/32  brain]
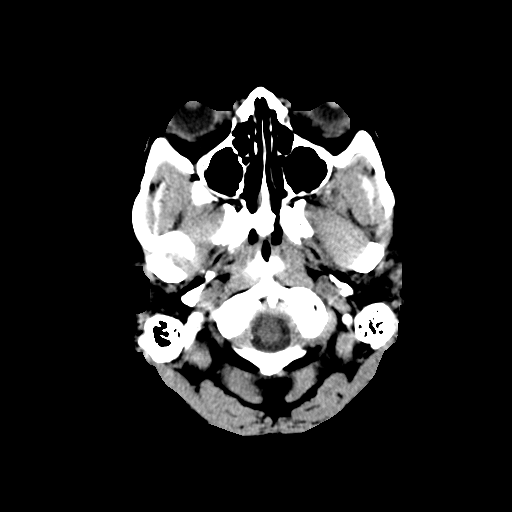
[im 3/32  bone]
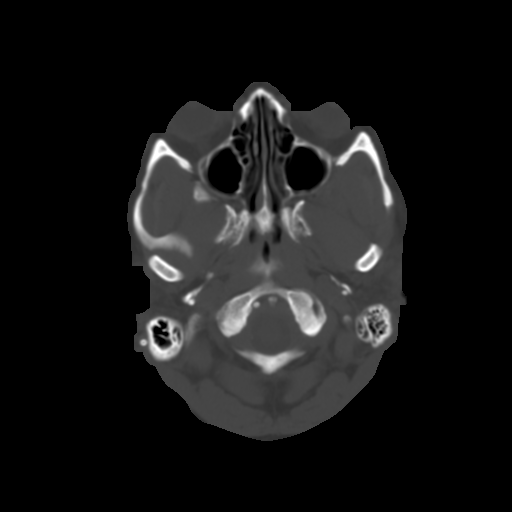
[im 5/32  brain]
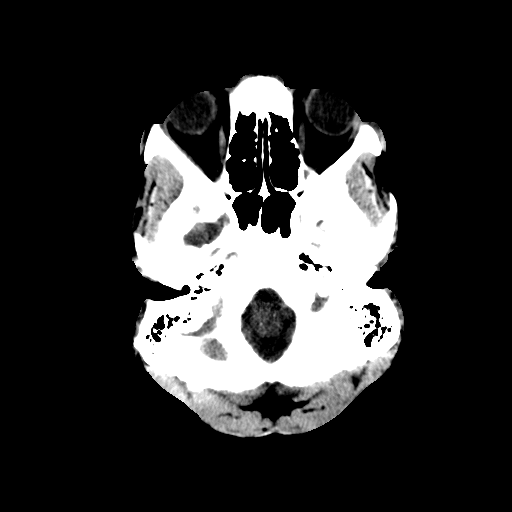
[im 7/32  brain]
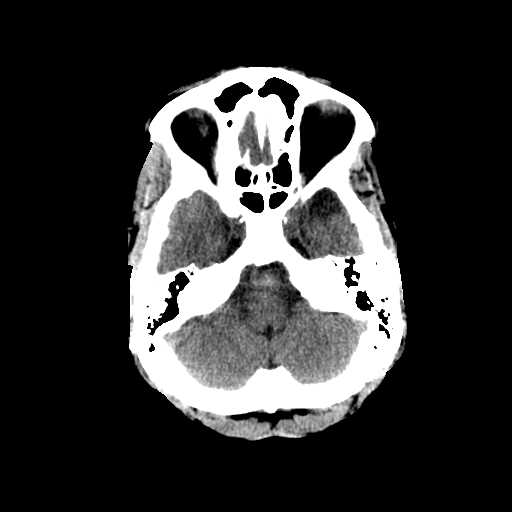
[im 9/32  brain]
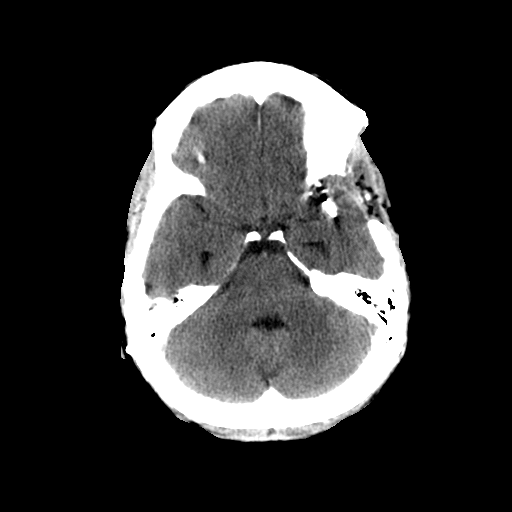
[im 12/32  brain]
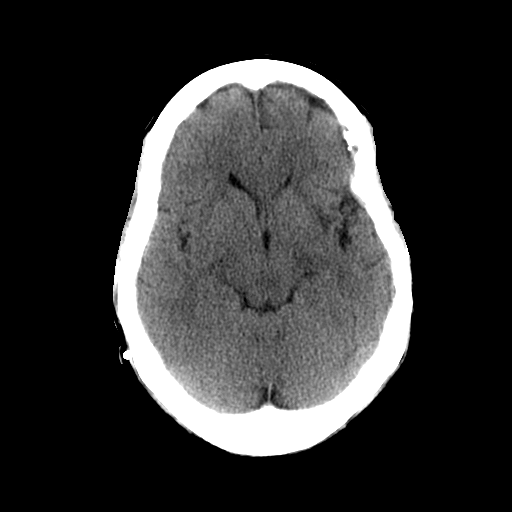
[im 12/32  bone]
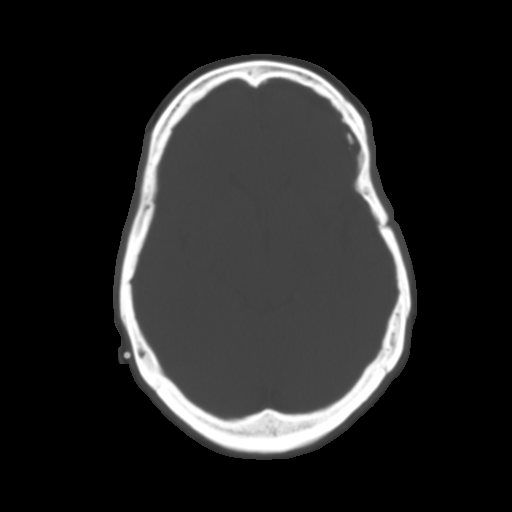
[im 14/32  brain]
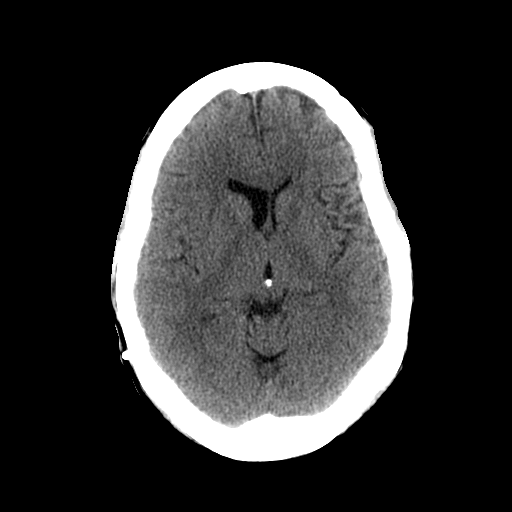
[im 16/32  brain]
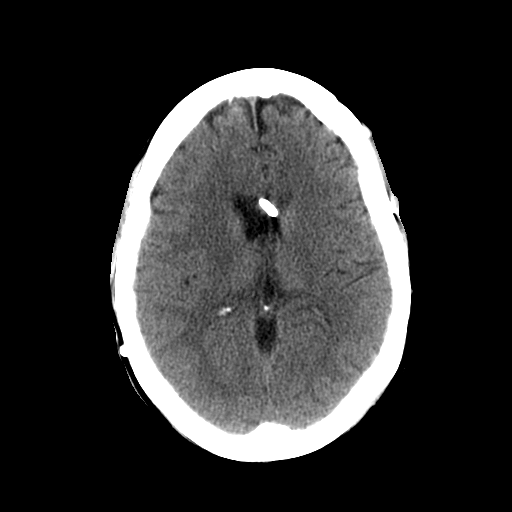
[im 18/32  brain]
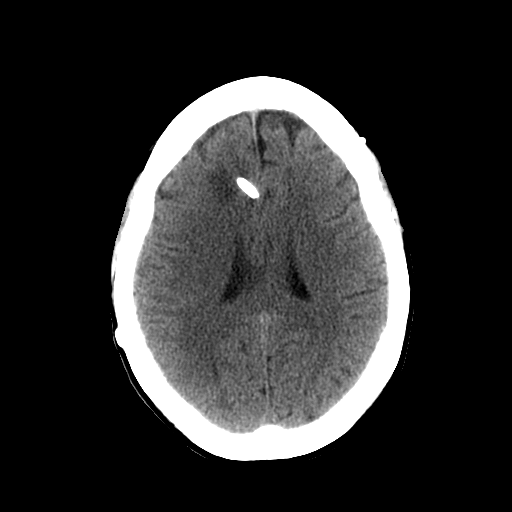
[im 20/32  brain]
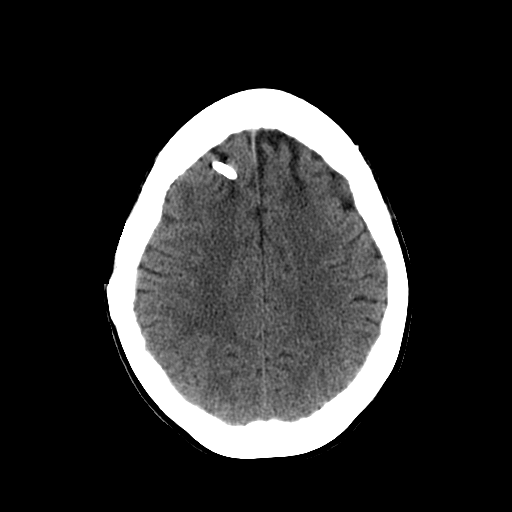
[im 20/32  bone]
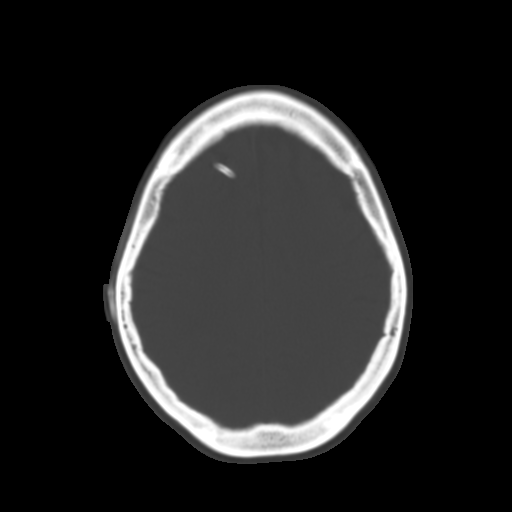
[im 23/32  brain]
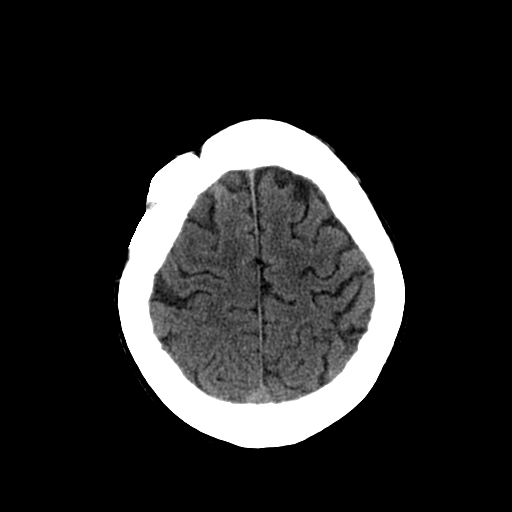
[im 25/32  brain]
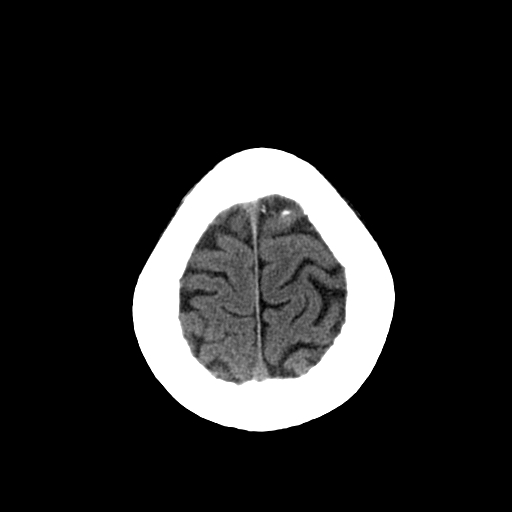
[im 27/32  brain]
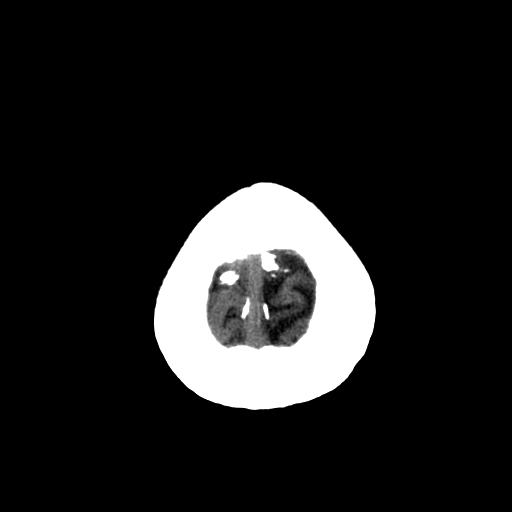
[im 29/32  brain]
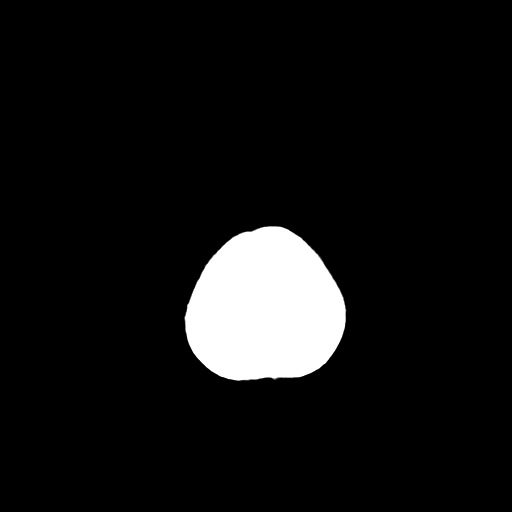
[im 29/32  bone]
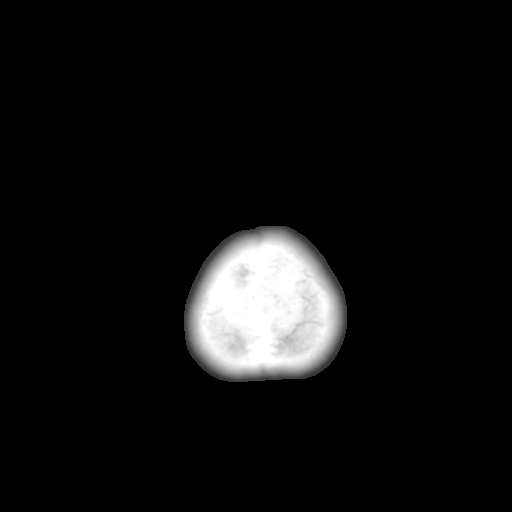

[13 of 30 positions shown; findings below may reference images not displayed]

FINDINGS: Stable surgical changes with prior left-sided craniotomy
and a right-sided ventriculoperitoneal shunt catheter.  The
ventricles are stable in size and configuration.  No extra-axial
fluid collections.  No CT findings for an acute intracranial
process.  There is a left MCA trifurcation region aneurysm clip is
noted.
IMPRESSION: 1.  No acute intracranial findings or mass lesion.
2.  No acute skull fracture.
3.  Remote surgical changes.  The ventriculostomy shunt catheter is
stable.

## 2012-11-24 ENCOUNTER — Telehealth: Payer: Self-pay | Admitting: Family Medicine

## 2012-11-24 MED ORDER — LEVOTHYROXINE SODIUM 150 MCG PO TABS: 150.0000 ug | ORAL_TABLET | Freq: Every day | ORAL | Status: AC

## 2012-11-24 NOTE — Telephone Encounter (Signed)
Rx sent with instructions for generic, please notify pt.

## 2012-11-24 NOTE — Telephone Encounter (Signed)
Pt needs a new refill on synthroid - needs it to be generic - pharmacy only has it as name brand - she will take last one tomorrow  [facility]- [address]

## 2012-12-28 ENCOUNTER — Other Ambulatory Visit: Payer: Self-pay | Admitting: Orthopedic Surgery

## 2012-12-28 DIAGNOSIS — M545 Low back pain: Secondary | ICD-10-CM

## 2013-01-02 ENCOUNTER — Telehealth: Payer: Self-pay | Admitting: Family Medicine

## 2013-01-02 NOTE — Telephone Encounter (Signed)
LMOVM for pt to return call with additional information.  Please get any additional info that you can. Crystal Weiss, Crystal Weiss

## 2013-01-02 NOTE — Telephone Encounter (Signed)
There was a housing form that was dropped off a few months ago that the patient said should still be in her file.  Since she is here in [place], she is going to drop a new one by.

## 2013-01-02 NOTE — Telephone Encounter (Signed)
Patient is calling to discuss the report that she needs to complete her housing.

## 2013-01-02 NOTE — Telephone Encounter (Signed)
Patient dropped off form to be filled out for [employer].  Please call her when completed.

## 2013-01-02 NOTE — Telephone Encounter (Signed)
To md. Mudlogger, Maryjo Rochester

## 2013-01-03 NOTE — Telephone Encounter (Signed)
Called and spoke with patient.  The paperwork she dropped off requires me to sign off on her having physical or mental impairments.  Based on her only visit with me, I cannot fill out this paperwork.  I have asked her to make an appointment to discuss this and do an evaluation.  She voices understanding.

## 2013-01-07 IMAGING — CR DG LUMBAR SPINE 2-3V
3 series · 3 of 3 positions shown · non-contrast
Comparison: None

CLINICAL DATA: Pain at left superior SI joint, left lower back pain

LUMBAR SPINE - 2-3 VIEW

[t l-spine a.p.]
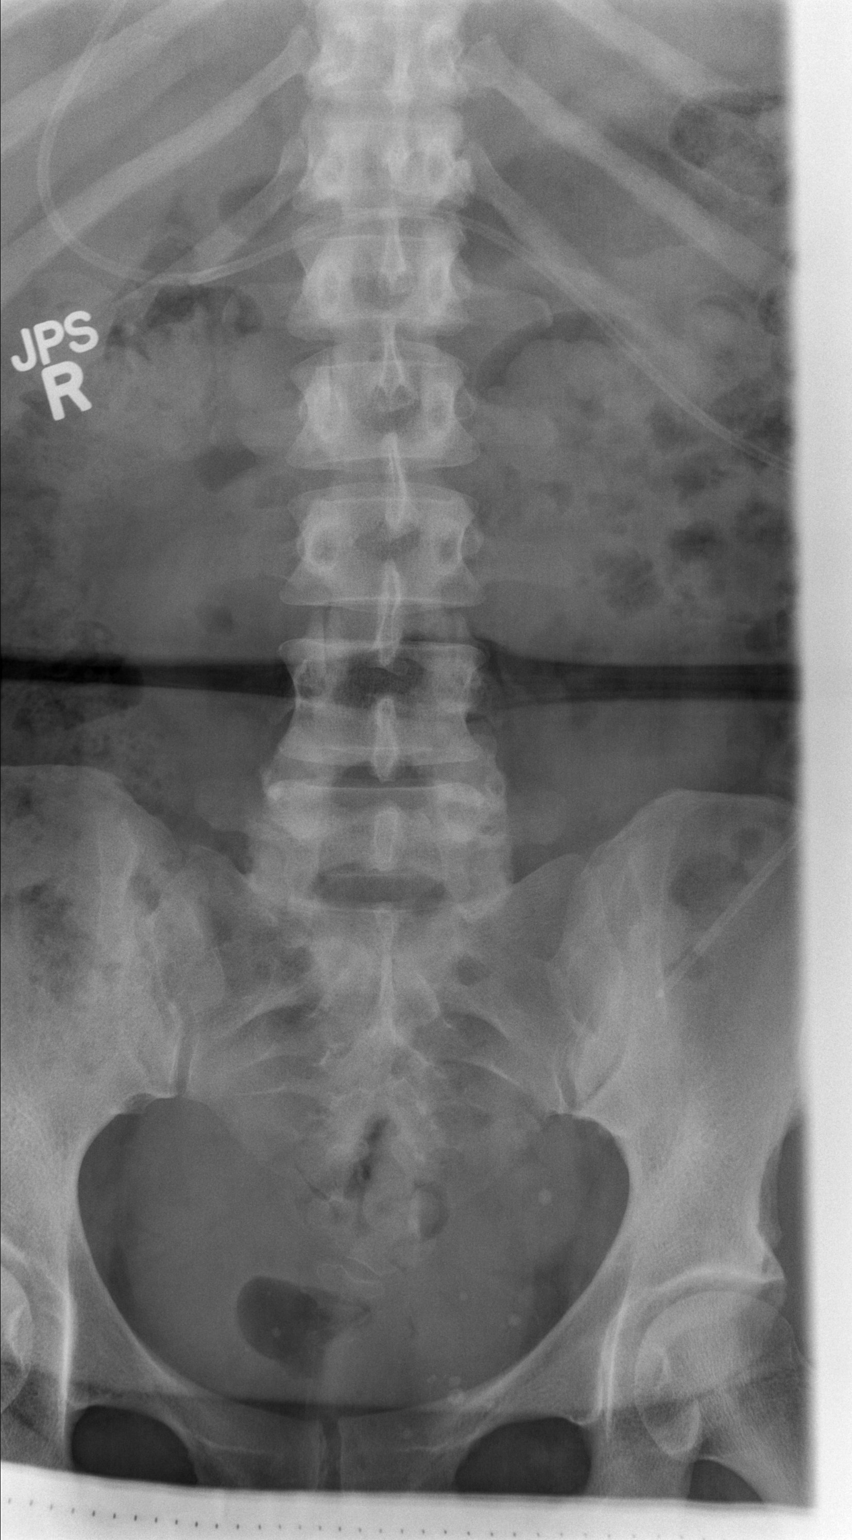

[t l-spine lat]
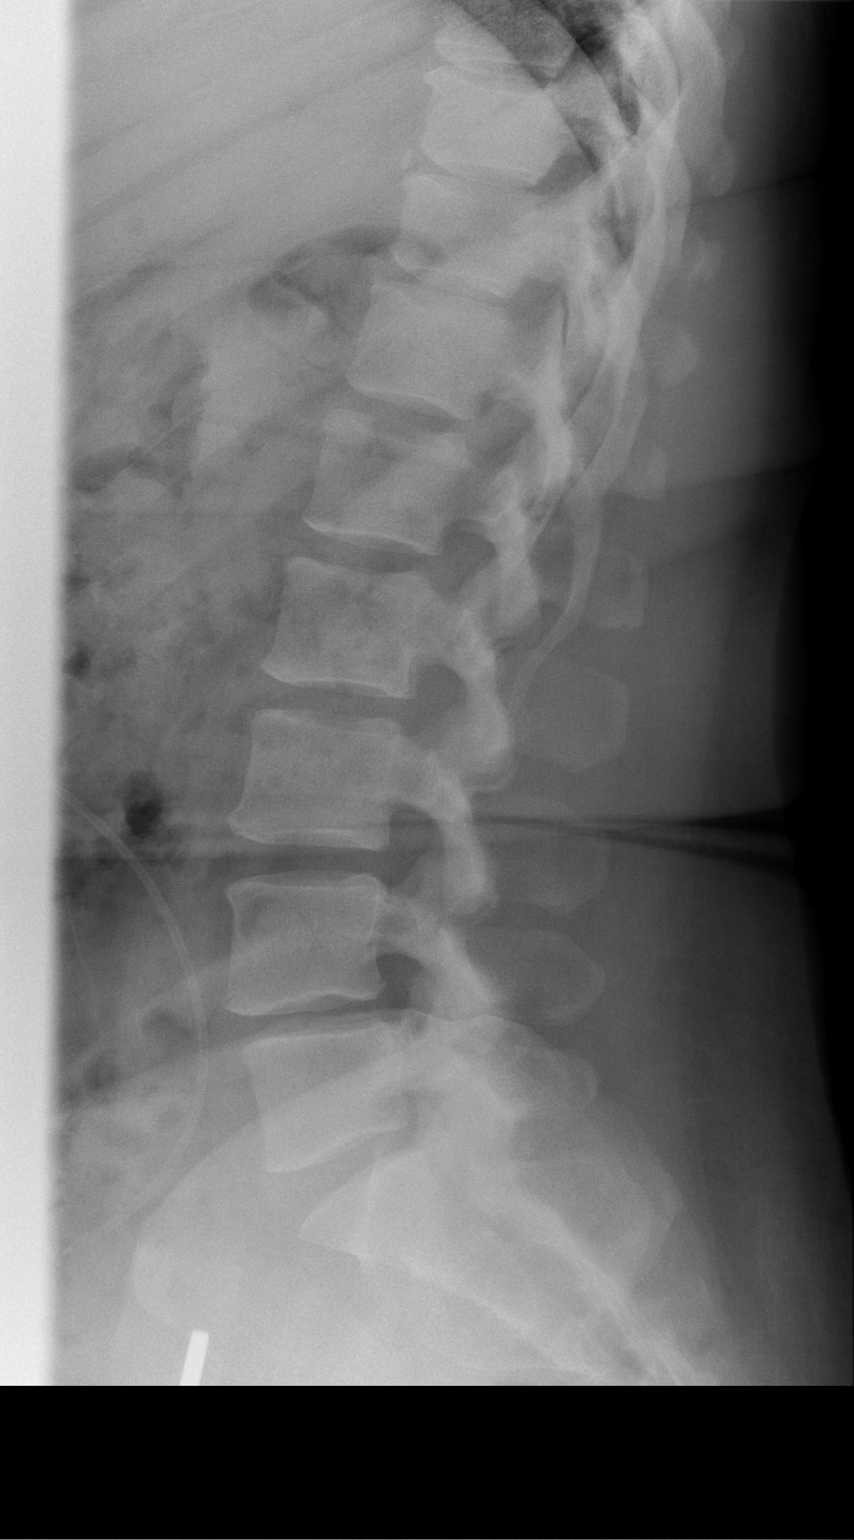

[t l-spine l5-s1 spot]
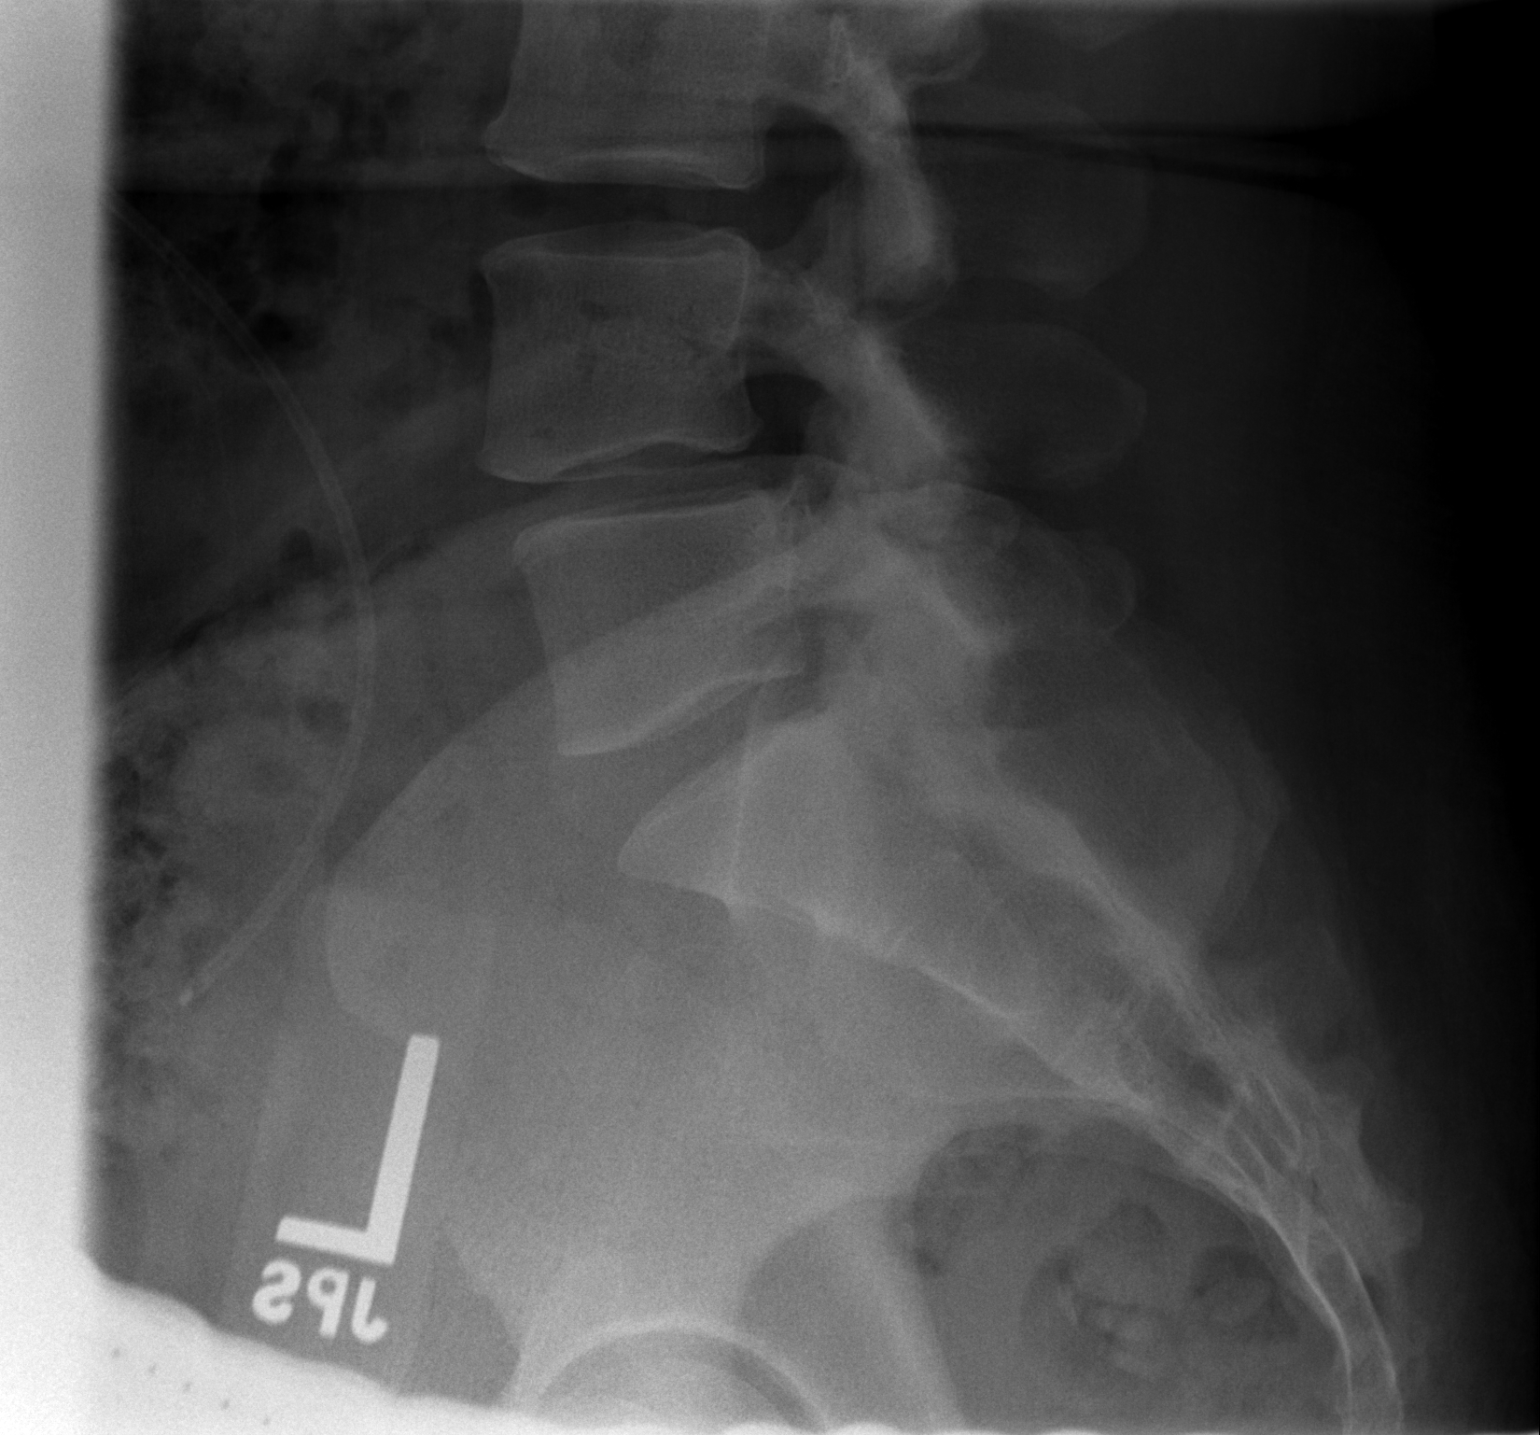

[3 of 3 positions shown; findings below may reference images not displayed]

FINDINGS: Five non-rib bearing lumbar vertebrae.
Osseous mineralization normal.
Minimal disc space narrowing L4-L5 with minimal anterolisthesis.
No acute fracture, additional subluxation or bone destruction.
No gross evidence of spondylolysis.
SI joints symmetric.
Mild facet degenerative changes lower lumbar spine.
VP shunt tubing in abdomen.
Numerous pelvic phleboliths.
IMPRESSION: Minimal degenerative disc and facet disease changes L4-L5.

## 2013-01-13 ENCOUNTER — Ambulatory Visit: Payer: Self-pay | Admitting: Family Medicine

## 2013-04-10 ENCOUNTER — Other Ambulatory Visit: Payer: Self-pay | Admitting: Family Medicine

## 2013-04-10 NOTE — Telephone Encounter (Signed)
Will FWD Rx request to MD.  .Radene Ou

## 2013-04-10 NOTE — Telephone Encounter (Signed)
Patient is calling to let Dr. Lula Olszewski know that she is completely out of her Metformin.

## 2013-07-05 ENCOUNTER — Telehealth: Payer: Self-pay | Admitting: *Deleted

## 2013-07-05 NOTE — Telephone Encounter (Signed)
Letter sent regarding diabetes follow up care Elizabeth Iolani Twilley, RN-BSN   

## 2014-01-12 ENCOUNTER — Telehealth: Payer: Self-pay

## 2014-01-12 NOTE — Telephone Encounter (Signed)
Left message for pt to call and schedule appointment for LDL/ ms
